# Patient Record
Sex: Female | Born: 2005 | Race: White | Hispanic: No | Marital: Single | Smoking: Never smoker
Health system: Southern US, Community
[De-identification: ages and names within clinical notes are randomized; demographics above are authoritative.]

## PROBLEM LIST (undated history)

## (undated) ENCOUNTER — Ambulatory Visit (HOSPITAL_BASED_OUTPATIENT_CLINIC_OR_DEPARTMENT_OTHER): Payer: MEDICAID

## (undated) ENCOUNTER — Encounter

## (undated) ENCOUNTER — Telehealth

## (undated) ENCOUNTER — Ambulatory Visit: Payer: MEDICAID

## (undated) ENCOUNTER — Encounter
Attending: Pharmacist Clinician (PhC)/ Clinical Pharmacy Specialist | Primary: Pharmacist Clinician (PhC)/ Clinical Pharmacy Specialist

## (undated) ENCOUNTER — Ambulatory Visit: Payer: Medicaid (Managed Care)

## (undated) ENCOUNTER — Encounter: Attending: Physician Assistant | Primary: Physician Assistant

## (undated) ENCOUNTER — Ambulatory Visit: Payer: MEDICAID | Attending: Physician Assistant | Primary: Physician Assistant

## (undated) ENCOUNTER — Encounter: Attending: Speech-Language Pathologist | Primary: Speech-Language Pathologist

## (undated) ENCOUNTER — Encounter
Attending: Student in an Organized Health Care Education/Training Program | Primary: Student in an Organized Health Care Education/Training Program

## (undated) ENCOUNTER — Encounter: Attending: Neurology | Primary: Neurology

## (undated) ENCOUNTER — Ambulatory Visit: Payer: MEDICAID | Attending: Clinical Neuropsychologist | Primary: Clinical Neuropsychologist

## (undated) ENCOUNTER — Ambulatory Visit

## (undated) ENCOUNTER — Ambulatory Visit: Payer: MEDICARE

## (undated) ENCOUNTER — Ambulatory Visit: Payer: MEDICAID | Attending: Neurology | Primary: Neurology

## (undated) ENCOUNTER — Telehealth
Attending: Pharmacist Clinician (PhC)/ Clinical Pharmacy Specialist | Primary: Pharmacist Clinician (PhC)/ Clinical Pharmacy Specialist

## (undated) ENCOUNTER — Ambulatory Visit
Payer: Medicaid (Managed Care) | Attending: Student in an Organized Health Care Education/Training Program | Primary: Student in an Organized Health Care Education/Training Program

## (undated) ENCOUNTER — Encounter: Attending: Mental Health | Primary: Mental Health

## (undated) ENCOUNTER — Ambulatory Visit: Attending: Pharmacist | Primary: Pharmacist

## (undated) ENCOUNTER — Ambulatory Visit: Attending: Family Medicine | Primary: Family Medicine

## (undated) ENCOUNTER — Telehealth: Attending: Mental Health | Primary: Mental Health

## (undated) ENCOUNTER — Ambulatory Visit: Attending: Surgery | Primary: Surgery

## (undated) ENCOUNTER — Telehealth: Attending: Neurology | Primary: Neurology

## (undated) DIAGNOSIS — R000 Tachycardia, unspecified: Secondary | ICD-10-CM

## (undated) DIAGNOSIS — F419 Anxiety disorder, unspecified: Secondary | ICD-10-CM

## (undated) DIAGNOSIS — J309 Allergic rhinitis, unspecified: Secondary | ICD-10-CM

## (undated) DIAGNOSIS — T7840XA Allergy, unspecified, initial encounter: Secondary | ICD-10-CM

## (undated) DIAGNOSIS — G35 Multiple sclerosis: Secondary | ICD-10-CM

## (undated) DIAGNOSIS — Z91018 Allergy to other foods: Secondary | ICD-10-CM

## (undated) DIAGNOSIS — D509 Iron deficiency anemia, unspecified: Secondary | ICD-10-CM

## (undated) DIAGNOSIS — K219 Gastro-esophageal reflux disease without esophagitis: Secondary | ICD-10-CM

## (undated) DIAGNOSIS — Z8669 Personal history of other diseases of the nervous system and sense organs: Secondary | ICD-10-CM

## (undated) DIAGNOSIS — L83 Acanthosis nigricans: Secondary | ICD-10-CM

## (undated) DIAGNOSIS — F913 Oppositional defiant disorder: Secondary | ICD-10-CM

## (undated) DIAGNOSIS — J45909 Unspecified asthma, uncomplicated: Secondary | ICD-10-CM

## (undated) DIAGNOSIS — H1010 Acute atopic conjunctivitis, unspecified eye: Secondary | ICD-10-CM

## (undated) DIAGNOSIS — F32A Depression, unspecified: Secondary | ICD-10-CM

## (undated) DIAGNOSIS — F329 Major depressive disorder, single episode, unspecified: Secondary | ICD-10-CM

## (undated) HISTORY — DX: Unspecified asthma, uncomplicated: J45.909

## (undated) HISTORY — DX: Tachycardia, unspecified: R00.0

## (undated) HISTORY — DX: Anxiety disorder, unspecified: F41.9

## (undated) HISTORY — DX: Iron deficiency anemia, unspecified: D50.9

## (undated) HISTORY — PX: TONSILECTOMY, ADENOIDECTOMY, BILATERAL MYRINGOTOMY AND TUBES: SHX2538

## (undated) HISTORY — DX: Major depressive disorder, single episode, unspecified: F32.9

## (undated) HISTORY — DX: Oppositional defiant disorder: F91.3

## (undated) HISTORY — DX: Gastro-esophageal reflux disease without esophagitis: K21.9

## (undated) HISTORY — DX: Multiple sclerosis: G35

## (undated) HISTORY — DX: Allergy to other foods: Z91.018

## (undated) HISTORY — DX: Acanthosis nigricans: L83

## (undated) HISTORY — PX: TONSILLECTOMY: SUR1361

## (undated) HISTORY — DX: Allergy, unspecified, initial encounter: T78.40XA

## (undated) HISTORY — DX: Personal history of other diseases of the nervous system and sense organs: Z86.69

## (undated) HISTORY — DX: Acute atopic conjunctivitis, unspecified eye: H10.10

## (undated) HISTORY — DX: Depression, unspecified: F32.A

## (undated) HISTORY — DX: Acute atopic conjunctivitis, unspecified eye: J30.9

## (undated) HISTORY — PX: ADENOIDECTOMY: SUR15

---
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
## 2024-01-15 ENCOUNTER — Other Ambulatory Visit: Payer: Self-pay | Admitting: Family Medicine

## 2024-01-15 DIAGNOSIS — G44221 Chronic tension-type headache, intractable: Secondary | ICD-10-CM

## 2024-01-19 ENCOUNTER — Encounter
Admit: 2024-01-19 | Discharge: 2024-01-20 | Payer: Medicaid (Managed Care) | Attending: Pharmacist Clinician (PhC)/ Clinical Pharmacy Specialist | Primary: Pharmacist Clinician (PhC)/ Clinical Pharmacy Specialist

## 2024-01-19 DIAGNOSIS — G35D Multiple sclerosis: Principal | ICD-10-CM

## 2024-01-19 DIAGNOSIS — Z5181 Encounter for therapeutic drug level monitoring: Principal | ICD-10-CM

## 2024-01-19 DIAGNOSIS — R5383 Other fatigue: Principal | ICD-10-CM

## 2024-02-21 ENCOUNTER — Encounter: Payer: Self-pay | Admitting: Family Medicine

## 2024-02-21 ENCOUNTER — Ambulatory Visit: Payer: MEDICAID | Admitting: Family Medicine

## 2024-02-21 VITALS — BP 128/82 | HR 86 | Temp 97.4°F | Ht 68.0 in | Wt 312.0 lb

## 2024-02-21 DIAGNOSIS — I10 Essential (primary) hypertension: Secondary | ICD-10-CM

## 2024-02-21 DIAGNOSIS — D649 Anemia, unspecified: Secondary | ICD-10-CM

## 2024-02-21 DIAGNOSIS — Z6841 Body Mass Index (BMI) 40.0 and over, adult: Secondary | ICD-10-CM

## 2024-02-21 DIAGNOSIS — G35D Multiple sclerosis, unspecified: Secondary | ICD-10-CM

## 2024-02-21 DIAGNOSIS — E66813 Obesity, class 3: Secondary | ICD-10-CM

## 2024-02-21 MED ORDER — WEGOVY 0.25 MG/0.5ML ~~LOC~~ SOAJ: 0.2500 mg | SUBCUTANEOUS | Status: AC

## 2024-02-21 NOTE — Progress Notes (Unsigned)
 Subjective:  Patient ID: Connie Clayton, female    DOB: 04-18-2005  Age: 18 y.o. MRN: [id number]  Chief Complaint  Patient presents with   Medical Management of Chronic Issues    HPI: Discussed the use of AI scribe software for clinical note transcription with the patient, who gave verbal consent to proceed.  History of Present Illness Connie Clayton is a 18 year old female with multiple sclerosis who presents for weight management. She is accompanied by her mother.  Weight management difficulties - Current weight is 312 pounds, decreased from 318 pounds in July (5 pound weight loss) - Difficulty with dietary changes and preference for eating out - Inconsistent meal pattern, does not consistently eat three meals per day  Multiple sclerosis disease course and treatment - Receiving ongoing treatment for multiple sclerosis - Recently initiated new infusion therapy administered twice yearly through [facility] provider - Infusions performed at [facility] in [place], with recent supply shortages affecting administration - Undergoes MRI imaging every six months for disease monitoring  Menstrual irregularity - Experiencing irregular menstrual cycles - Previously had regular periods - Not sexually active  Constitutional and infectious symptoms - No cold symptoms, fevers, chills, sweats, or earaches       02/21/2024    2:36 PM 10/25/2023    9:30 AM 07/07/2023    3:04 PM 08/18/2022   11:11 AM 01/14/2020   12:47 PM  Depression screen PHQ 2/9  Decreased Interest 0 1 0 0 0  Down, Depressed, Hopeless 0 0 0 0 0  PHQ - 2 Score 0 1 0 0 0  Altered sleeping 0 2 1 0 0  Tired, decreased energy 0 2 0 0 0  Change in appetite 0 0 0 0 2  Feeling bad or failure about yourself  0 0 0 0 0  Trouble concentrating 0 0 0 0 0  Moving slowly or fidgety/restless 0 0 0 0 0  Suicidal thoughts 0 0 0 0 0  PHQ-9 Score 0 5  1  0  2   Difficult doing work/chores Not difficult at all Somewhat  difficult Not difficult at all Not difficult at all Not difficult at all     Data saved with a previous flowsheet row definition        02/21/2024    2:36 PM  Fall Risk   Falls in the past year? 0  Number falls in past yr: 0  Injury with Fall? 0  Risk for fall due to : No Fall Risks  Follow up Falls evaluation completed    Patient Care Team: Sherre Clapper, MD as PCP - General (Family Medicine)   Review of Systems  Current Outpatient Medications on File Prior to Visit  Medication Sig Dispense Refill   cetirizine (ZYRTEC) 10 MG tablet Take by mouth.     Cholecalciferol (VITAMIN D -3) 125 MCG (5000 UT) TABS Take by mouth.     clobetasol (TEMOVATE) 0.05 % external solution APPLY TWICE DAILY TO SCALP FOR 2 WEEKS, 1 WEEK OFF, REPEAT AS NEEDED     DULoxetine  (CYMBALTA ) 60 MG capsule Take 1 capsule (60 mg total) by mouth daily. 30 capsule 0   eletriptan  (RELPAX ) 20 MG tablet Take at onset of migraine, May repeat in 2 hours if headache persists or recurs. (Patient taking differently: Take 20 mg by mouth every 2 (two) hours as needed. Take at onset of migraine, May repeat in 2 hours if headache persists or recurs.) 8 tablet 2   EPINEPHrine  (  EPIPEN  2-PAK) 0.3 mg/0.3 mL IJ SOAJ injection Inject 0.3 mg into the muscle as needed for anaphylaxis. 2 each 1   gabapentin (NEURONTIN) 400 MG capsule Take 400 mg by mouth 2 (two) times daily.     ketoconazole  (NIZORAL ) 2 % shampoo Apply topically.     NIFEdipine  (PROCARDIA  XL/NIFEDICAL-XL) 90 MG 24 hr tablet TAKE ONE TABLET BY MOUTH DAILY 90 tablet 1   omeprazole  (PRILOSEC) 40 MG capsule Take 1 capsule (40 mg total) by mouth daily. 90 capsule 3   propranolol (INDERAL) 10 MG tablet TAKE ONE TABLET BY MOUTH AT LEAST 1 HOUR BEFORE STRESSFUL EVENT (Patient taking differently: Take 10 mg by mouth as needed (one hur before a stressful event).)     triamcinolone (NASACORT) 55 MCG/ACT AERO nasal inhaler Place 1 spray into the nose daily. (Patient taking  differently: Place 1 spray into the nose daily as needed.)     albuterol  (PROAIR  HFA) 108 (90 Base) MCG/ACT inhaler Can inhale two puffs every four to six hours as needed for cough or wheeze. (Patient taking differently: Inhale 1 puff into the lungs every 4 (four) hours as needed for shortness of breath or wheezing. Can inhale two puffs every four to six hours as needed for cough or wheeze.) 18 g 1   ocrelizumab (OCREVUS) 300 MG/10ML injection Inject 600 mg into the vein. Every 6 months     No current facility-administered medications on file prior to visit.   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   Tachycardia    Past Surgical History:  Procedure Laterality Date   ADENOIDECTOMY     CHOLECYSTECTOMY  2019   TONSILECTOMY, ADENOIDECTOMY, BILATERAL MYRINGOTOMY AND TUBES     age 36   TONSILLECTOMY      Family History  Problem Relation Age of Onset   Obesity Mother    GI problems Mother        acid reflux   Hypothyroidism Mother    Depression Mother    Anxiety disorder Mother    Hypertension Mother    Cirrhosis Mother        non-alcoholic- had liver transplant   Obesity Father    GI problems Father        acid reflux   Allergies Father    GI problems Brother        acid reflux   Tourette syndrome Brother    OCD Brother    Anxiety disorder Brother    Depression Brother    ADD / ADHD Brother    Breast cancer Maternal Grandmother    Hypertension Maternal Grandmother    Diabetes type II Maternal Grandmother    Stroke Maternal Grandfather    Heart attack Maternal Grandfather    Heart disease Maternal  Grandfather    Celiac disease Paternal Grandmother    Hypertension Paternal Grandfather    Heart disease Paternal Grandfather    Social History   Socioeconomic History   Marital status: Single    Spouse name: Not on file   Number  of children: Not on file   Years of education: Not on file   Highest education level: Not on file  Occupational History   Occupation: student  Tobacco Use   Smoking status: Never   Smokeless tobacco: Never  Vaping Use   Vaping status: Never Used  Substance and Sexual Activity   Alcohol use: Never   Drug use: Never   Sexual activity: Never  Other Topics Concern   Not on file  Social History Narrative   Lives with mom, dad, and brother.    She is in 9th grade at [employer]. She is currently going all virtual classes due to the covid virus.    She enjoys listening to music and singing, [place], and making Polo.    Social Drivers of Corporate Investment Banker Strain: Low Risk  (07/07/2023)   Overall Financial Resource Strain (CARDIA)    Difficulty of Paying Living Expenses: Not hard at all  Food Insecurity: No Food Insecurity (07/07/2023)   Hunger Vital Sign    Worried About Running Out of Food in the Last Year: Never true    Ran Out of Food in the Last Year: Never true  Transportation Needs: No Transportation Needs (07/07/2023)   PRAPARE - Administrator, Civil Service (Medical): No    Lack of Transportation (Non-Medical): No  Physical Activity: Inactive (07/07/2023)   Exercise Vital Sign    Days of Exercise per Week: 0 days    Minutes of Exercise per Session: 0 min  Stress: No Stress Concern Present (07/07/2023)   [employer] of Occupational Health - Occupational Stress Questionnaire    Feeling of Stress : Not at all  Social Connections: Socially Isolated (07/07/2023)   Social Connection and Isolation Panel    Frequency of Communication with Friends and Family: More than three times a week    Frequency of Social  Gatherings with Friends and Family: More than three times a week    Attends Religious Services: Never    Database Administrator or Organizations: No    Attends Engineer, Structural: Never    Marital Status: Never married    Objective:  BP 128/82 (BP Location: Left Arm, Patient Position: Sitting)   Pulse 86   Temp (!) 97.4 F (36.3 C) (Temporal)   Ht 5' 8 (1.727 m)   Wt (!) 312 lb (141.5 kg)   SpO2 98%   BMI 47.44 kg/m      02/21/2024    2:35 PM 12/20/2023    3:02 PM 11/06/2023   12:45 PM  BP/Weight  Systolic BP 128 106 139  Diastolic BP 82 72 91  Wt. (Lbs) 312 315.4   BMI 47.44 kg/m2 47.96 kg/m2     Physical Exam  {Perform Simple Foot Exam  Perform Detailed exam:1} {Insert foot Exam (Optional):30965}   Lab Results  Component Value Date   WBC 6.1 04/21/2022   HGB 12.7 04/21/2022   HCT 37.7 04/21/2022   PLT 426 04/21/2022   GLUCOSE 88 04/21/2022   CHOL 189 (H) 07/28/2021   TRIG 95 (H) 07/28/2021   HDL 56 07/28/2021   LDLCALC 116 (H) 07/28/2021   ALT 29 (H) 04/21/2022   AST 19 04/21/2022   NA 138 04/21/2022   K 4.3 04/21/2022   CL 106 04/21/2022   CREATININE 0.70 04/21/2022   BUN 12 04/21/2022   CO2 21 04/21/2022   TSH 0.757 04/21/2022   HGBA1C 5.0 07/28/2021    Results for  orders placed or performed in visit on 04/21/22  CBC with Differential/Platelet   Collection Time: 04/21/22  4:08 PM  Result Value Ref Range   WBC 6.1 3.4 - 10.8 x10E3/uL   RBC 4.50 3.77 - 5.28 x10E6/uL   Hemoglobin 12.7 11.1 - 15.9 g/dL   Hematocrit 62.2 65.9 - 46.6 %   MCV 84 79 - 97 fL   MCH 28.2 26.6 - 33.0 pg   MCHC 33.7 31.5 - 35.7 g/dL   RDW 87.8 88.2 - 84.5 %   Platelets 426 150 - 450 x10E3/uL   Neutrophils 79 Not Estab. %   Lymphs 7 Not Estab. %   Monocytes 12 Not Estab. %   Eos 2 Not Estab. %   Basos 0 Not Estab. %   Neutrophils Absolute 4.8 1.4 - 7.0 x10E3/uL   Lymphocytes Absolute 0.4 (L) 0.7 - 3.1 x10E3/uL   Monocytes Absolute 0.7 0.1 - 0.9 x10E3/uL    EOS (ABSOLUTE) 0.1 0.0 - 0.4 x10E3/uL   Basophils Absolute 0.0 0.0 - 0.3 x10E3/uL   Immature Granulocytes 0 Not Estab. %   Immature Grans (Abs) 0.0 0.0 - 0.1 x10E3/uL  Comprehensive metabolic panel   Collection Time: 04/21/22  4:08 PM  Result Value Ref Range   Glucose 88 70 - 99 mg/dL   BUN 12 5 - 18 mg/dL   Creatinine, Ser 9.29 0.57 - 1.00 mg/dL   eGFR CANCELED fO/fpw/8.26   BUN/Creatinine Ratio 17 10 - 22   Sodium 138 134 - 144 mmol/L   Potassium 4.3 3.5 - 5.2 mmol/L   Chloride 106 96 - 106 mmol/L   CO2 21 20 - 29 mmol/L   Calcium 9.0 8.9 - 10.4 mg/dL   Total Protein 6.2 6.0 - 8.5 g/dL   Albumin 4.5 4.0 - 5.0 g/dL   Globulin, Total 1.7 1.5 - 4.5 g/dL   Albumin/Globulin Ratio 2.6 (H) 1.2 - 2.2   Bilirubin Total 0.3 0.0 - 1.2 mg/dL   Alkaline Phosphatase 101 47 - 113 IU/L   AST 19 0 - 40 IU/L   ALT 29 (H) 0 - 24 IU/L  TSH   Collection Time: 04/21/22  4:08 PM  Result Value Ref Range   TSH 0.757 0.450 - 4.500 uIU/mL  POC COVID-19 BinaxNow   Collection Time: 04/21/22  4:11 PM  Result Value Ref Range   SARS Coronavirus 2 Ag Negative Negative  .  Assessment & Plan:   Assessment & Plan Essential hypertension, benign The current medical regimen is effective;  continue present plan and medications. Continue procardia  xl 90 mg daily.  Orders:   Comprehensive metabolic panel with GFR   Anemia, unspecified type Check labs Orders:   Fe+CBC/D/Plt+TIBC+Fer+Retic  MS (multiple sclerosis) Well-controlled with current treatment. Regular MRIs every six months. New local infusion center available. - Continue current infusion therapy regimen. - Continue regular MRIs every six months. - Utilize local infusion center.    Class 3 severe obesity due to excess calories with serious comorbidity and body mass index (BMI) of 45.0 to 49.9 in adult [facility]) Weight loss of 5 pounds since July. Previous Wegovy  trial too short for efficacy assessment. Insurance issues with Wegovy  coverage  discussed. Considered Wegovy  samples for efficacy assessment before cost commitment. - Provided Wegovy  samples to assess efficacy before cost commitment. - Encouraged dietary modifications, focusing on protein and green vegetables when eating out. - Will appeal insurance denial for Wegovy  coverage.     Body mass index is 47.44 kg/m.   Meds ordered  this encounter  Medications   semaglutide -weight management (WEGOVY ) 0.25 MG/0.5ML SOAJ SQ injection    Sig: Inject 0.25 mg into the skin once a week.    Orders Placed This Encounter  Procedures   Comprehensive metabolic panel with GFR   Fe+CBC/D/Plt+TIBC+Fer+Retic      I,Marla I Leal-Borjas,acting as a scribe for Abigail Free, MD.,have documented all relevant documentation on the behalf of Abigail Free, MD,as directed by  Abigail Free, MD while in the presence of Abigail Free, MD.   Follow-up: Return in about 4 weeks (around 03/20/2024) for chronic follow up.  An After Visit Summary was printed and given to the patient.  Abigail Free, MD [facility] [phone]

## 2024-02-21 NOTE — Patient Instructions (Addendum)
 [facility] at [facility] [address] [place],  [place]  [address] Main: [phone] Fax: [phone]  START ON WEGOVY  0.25 MG WEEKLY.

## 2024-02-25 DIAGNOSIS — D649 Anemia, unspecified: Secondary | ICD-10-CM | POA: Insufficient documentation

## 2024-02-25 NOTE — Assessment & Plan Note (Signed)
 Weight loss of 5 pounds since July. Previous Wegovy  trial too short for efficacy assessment. Insurance issues with Wegovy  coverage discussed. Considered Wegovy  samples for efficacy assessment before cost commitment. - Provided Wegovy  samples to assess efficacy before cost commitment. - Encouraged dietary modifications, focusing on protein and green vegetables when eating out. - Will appeal insurance denial for Wegovy  coverage.

## 2024-02-25 NOTE — Assessment & Plan Note (Signed)
 Well-controlled with current treatment. Regular MRIs every six months. New local infusion center available. - Continue current infusion therapy regimen. - Continue regular MRIs every six months. - Utilize local infusion center.

## 2024-02-25 NOTE — Assessment & Plan Note (Signed)
 Check labs Orders:   Fe+CBC/D/Plt+TIBC+Fer+Retic

## 2024-02-25 NOTE — Assessment & Plan Note (Signed)
 The current medical regimen is effective;  continue present plan and medications. Continue procardia  xl 90 mg daily.  Orders:   Comprehensive metabolic panel with GFR

## 2024-02-26 ENCOUNTER — Other Ambulatory Visit: Payer: Self-pay

## 2024-02-26 DIAGNOSIS — I10 Essential (primary) hypertension: Secondary | ICD-10-CM

## 2024-02-26 DIAGNOSIS — D649 Anemia, unspecified: Secondary | ICD-10-CM

## 2024-02-28 ENCOUNTER — Other Ambulatory Visit: Payer: MEDICAID

## 2024-02-28 ENCOUNTER — Other Ambulatory Visit: Payer: Self-pay | Admitting: Family Medicine

## 2024-02-28 DIAGNOSIS — I10 Essential (primary) hypertension: Secondary | ICD-10-CM

## 2024-02-28 DIAGNOSIS — D649 Anemia, unspecified: Secondary | ICD-10-CM

## 2024-02-28 DIAGNOSIS — G35D Multiple sclerosis, unspecified: Secondary | ICD-10-CM

## 2024-03-06 ENCOUNTER — Other Ambulatory Visit: Payer: Self-pay

## 2024-03-06 ENCOUNTER — Other Ambulatory Visit: Payer: Self-pay | Admitting: Family Medicine

## 2024-03-06 DIAGNOSIS — I10 Essential (primary) hypertension: Secondary | ICD-10-CM

## 2024-03-06 DIAGNOSIS — D649 Anemia, unspecified: Secondary | ICD-10-CM

## 2024-03-27 ENCOUNTER — Encounter: Payer: MEDICAID | Admitting: Family Medicine

## 2024-04-13 LAB — COMPREHENSIVE METABOLIC PANEL WITH GFR
ALT: 27 IU/L (ref 0–32)
AST: 15 IU/L (ref 0–40)
Albumin: 4.8 g/dL (ref 4.0–5.0)
Alkaline Phosphatase: 105 IU/L (ref 42–106)
BUN/Creatinine Ratio: 19 (ref 9–23)
BUN: 14 mg/dL (ref 6–20)
Bilirubin Total: 0.3 mg/dL (ref 0.0–1.2)
CO2: 19 mmol/L — ABNORMAL LOW (ref 20–29)
Calcium: 10 mg/dL (ref 8.7–10.2)
Chloride: 103 mmol/L (ref 96–106)
Creatinine, Ser: 0.74 mg/dL (ref 0.57–1.00)
Globulin, Total: 2.1 g/dL (ref 1.5–4.5)
Glucose: 82 mg/dL (ref 70–99)
Potassium: 4.4 mmol/L (ref 3.5–5.2)
Sodium: 139 mmol/L (ref 134–144)
Total Protein: 6.9 g/dL (ref 6.0–8.5)
eGFR: 119 mL/min/1.73

## 2024-04-13 LAB — IRON,TIBC AND FERRITIN PANEL
Ferritin: 28 ng/mL (ref 15–77)
Iron Saturation: 9 % — CL (ref 15–55)
Iron: 35 ug/dL (ref 27–159)
Total Iron Binding Capacity: 369 ug/dL (ref 250–450)
UIBC: 334 ug/dL (ref 131–425)

## 2024-04-20 ENCOUNTER — Ambulatory Visit: Payer: Self-pay | Admitting: Family Medicine

## 2024-04-20 ENCOUNTER — Other Ambulatory Visit: Payer: Self-pay | Admitting: Family Medicine

## 2024-06-19 ENCOUNTER — Ambulatory Visit: Payer: MEDICAID | Admitting: Allergy and Immunology
